# Patient Record
Sex: Female | Born: 1980 | Race: White | Hispanic: No | Marital: Single | Smoking: Former smoker
Health system: Southern US, Community
[De-identification: ages and names within clinical notes are randomized; demographics above are authoritative.]

## PROBLEM LIST (undated history)

## (undated) DIAGNOSIS — N83209 Unspecified ovarian cyst, unspecified side: Secondary | ICD-10-CM

## (undated) HISTORY — DX: Unspecified ovarian cyst, unspecified side: N83.209

## (undated) HISTORY — PX: WISDOM TOOTH EXTRACTION: SHX21

---

## 2010-10-02 ENCOUNTER — Ambulatory Visit (INDEPENDENT_AMBULATORY_CARE_PROVIDER_SITE_OTHER): Payer: 59 | Admitting: Physician Assistant

## 2010-10-02 ENCOUNTER — Encounter: Payer: Self-pay | Admitting: *Deleted

## 2010-10-02 VITALS — BP 140/79 | HR 73 | Temp 98.3°F | Resp 16 | Ht 66.0 in | Wt 180.0 lb

## 2010-10-02 DIAGNOSIS — Z01419 Encounter for gynecological examination (general) (routine) without abnormal findings: Secondary | ICD-10-CM

## 2010-10-02 DIAGNOSIS — N83209 Unspecified ovarian cyst, unspecified side: Secondary | ICD-10-CM

## 2010-10-16 ENCOUNTER — Encounter: Payer: Self-pay | Admitting: Family Medicine

## 2010-10-16 ENCOUNTER — Inpatient Hospital Stay (INDEPENDENT_AMBULATORY_CARE_PROVIDER_SITE_OTHER)
Admission: RE | Admit: 2010-10-16 | Discharge: 2010-10-16 | Disposition: A | Discharge status: Home / Self Care | Payer: 59 | Source: Ambulatory Visit | Attending: Family Medicine | Admitting: Family Medicine

## 2010-10-16 DIAGNOSIS — L03119 Cellulitis of unspecified part of limb: Secondary | ICD-10-CM

## 2010-10-18 ENCOUNTER — Ambulatory Visit
Admission: RE | Admit: 2010-10-18 | Discharge: 2010-10-18 | Disposition: A | Discharge status: Home / Self Care | Payer: 59 | Source: Ambulatory Visit | Attending: Physician Assistant | Admitting: Physician Assistant

## 2010-10-18 ENCOUNTER — Telehealth (INDEPENDENT_AMBULATORY_CARE_PROVIDER_SITE_OTHER): Payer: Self-pay | Admitting: *Deleted

## 2010-10-18 DIAGNOSIS — N83209 Unspecified ovarian cyst, unspecified side: Secondary | ICD-10-CM

## 2010-10-20 ENCOUNTER — Telehealth: Payer: Self-pay | Admitting: Emergency Medicine

## 2010-10-30 ENCOUNTER — Ambulatory Visit: Payer: 59 | Admitting: Physician Assistant

## 2010-10-30 VITALS — BP 146/90 | HR 78 | Temp 98.2°F | Resp 17 | Ht 67.0 in | Wt 178.0 lb

## 2010-10-30 DIAGNOSIS — N83299 Other ovarian cyst, unspecified side: Secondary | ICD-10-CM

## 2010-10-30 NOTE — Patient Instructions (Signed)
Ovarian Cyst   The ovaries are small organs that are on each side of the uterus. The ovaries are the organs that produce the female hormones, estrogen and progesterone. An ovarian cyst is a sac filled with fluid that can vary in its size. It is normal for a small cyst to form in women who are in the childbearing age and who have menstrual periods. This type of cyst is called a follicle cyst that becomes an ovulation cyst (corpus luteum cyst) after it produces the women’s egg. It later goes away on its own if the woman does not become pregnant. There are other kinds of ovarian cysts that may cause problems and may need to be treated. The most serious problem is a cyst with cancer. It should be noted that menopausal women who have an ovarian cyst are at a higher risk of it being a cancer cyst. They should be evaluated very quickly, thoroughly and followed closely. This is especially true in menopausal women because of the high rate of ovarian cancer in women in menopause.   CAUSES AND TYPES OF OVARIAN CYSTS:   FUNCTIONAL CYST: The follicle/corpus luteum cyst is a functional cyst that occurs every month during ovulation with the menstrual cycle. They go away with the next menstrual cycle if the woman does not get pregnant. Usually, there are no symptoms with a functional cyst.   ENDOMETRIOMA CYST: This cyst develops from the lining of the uterus tissue. This cyst gets in or on the ovary. It grows every month from the bleeding during the menstrual period. It is also called a “chocolate cyst” because it becomes filled with blood that turns brown. This cyst can cause pain in the lower abdomen during intercourse and with your menstrual period.   CYSTADENOMA CYST: This cyst develops from the cells on the outside of the ovary. They usually are not cancerous. They can get very big and cause lower abdomen pain and pain with intercourse. This type of cyst can twist on itself, cut off its blood supply and cause severe pain. It  also can easily rupture and cause a lot of pain.   DERMOID CYST: This type of cyst is sometimes found in both ovaries. They are found to have different kinds of body tissue in the cyst. The tissue includes skin, teeth, hair, and/or cartilage. They usually do not have symptoms unless they get very big. Dermoid cysts are rarely cancerous.   POLYCYSTIC OVARY: This is a rare condition with hormone problems that produces many small cysts on both ovaries. The cysts are follicle-like cysts that never produce an egg and become a corpus luteum. It can cause an increase in body weight, infertility, acne, increase in body and facial hair and lack of menstrual periods or rare menstrual periods. Many women with this problem develop type 2 diabetes. The exact cause of this problem is unknown. A polycystic ovary is rarely cancerous.   THECA LUTEIN CYST: Occurs when too much hormone (human chorionic gonadotropin) is produced and over-stimulates the ovaries to produce an egg. They are frequently seen when doctors stimulate the ovaries for invitro-fertilization (test tube babies).   LUTEOMA CYST: This cyst is seen during pregnancy. Rarely it can cause an obstruction to the birth canal during labor and delivery. They usually go away after delivery.   SYMPTOMS   Pelvic pain or pressure.   Pain during sexual intercourse.   Increasing girth (swelling) of the abdomen.   Abnormal menstrual periods.   Increasing pain with menstrual   periods.   You stop having menstrual periods and you are not pregnant.   DIAGNOSIS   The diagnosis can be made during:   Routine or annual pelvic examination (common).   Ultrasound.   X-ray of the pelvis.   CT Scan.   MRI.   Blood tests.   TREATMENT   Treatment may only be to follow the cyst monthly for 2 to 3 months with your caregiver. Many go away on their own, especially functional cysts.   May be aspirated (drained) with a long needle with ultrasound, or by laparoscopy (inserting a tube into the pelvis  through a small incision).   The whole cyst can be removed by laparoscopy.   Sometimes the cyst may need to be removed through an incision in the lower abdomen.   Hormone treatment is sometimes used to help dissolve certain cysts.   Birth control pills are sometimes used to help dissolve certain cysts.   HOME CARE INSTRUCTIONS   Follow your caregiver's advice regarding:   Medicine.   Follow up visits to evaluate and treat the cyst.   You may need to come back or make an appointment with another caregiver, to find the exact cause of your cyst, if your caregiver is not a gynecologist.   Get your yearly and recommended pelvic examinations and Pap tests.   Let your caregiver know if you have had an ovarian cyst in the past.   SEEK MEDICAL CARE IF:   Your periods are late, irregular, they stop, or are painful.   Your stomach (abdomen) or pelvic pain does not go away.   Your stomach becomes larger or swollen.   You have pressure on your bladder or trouble emptying your bladder completely.   You have painful sexual intercourse.   You have feelings of fullness, pressure, or discomfort in your stomach.   You lose weight for no apparent reason.   You feel generally ill.   You become constipated.   You lose your appetite.   You develop acne.   You have an increase in body and facial hair.   You are gaining weight, without changing your exercise and eating habits.   You think you are pregnant.   SEEK IMMEDIATE MEDICAL CARE IF:   You have increasing abdominal pain.   You feel sick to your stomach (nausea) and/or vomit.   You develop a fever that comes on suddenly.   You develop abdominal pain during a bowel movement.   Your menstrual periods become heavier than usual.   Document Released: 01/22/2005 Document Re-Released: 01/10/2009   [employer]® Patient Information ©2011 [employer].

## 2010-10-30 NOTE — Progress Notes (Signed)
Chief Complaint:  Results   Ruth Carlson is  30 y.o. G0P0000.  Patient's last menstrual period was 10/12/2010..  She presents complaining of Results Pt presents to discuss results from FU pelvic ultrasound to re-evaluate left ovarian cyst. No complaints   Past Medical History: Past Medical History  Diagnosis Date  . Ovarian cyst     Past Surgical History: Past Surgical History  Procedure Date  . Wisdom tooth extraction     Family History: Family History  Problem Relation Age of Onset  . Diabetes Father   . Diabetes Paternal Grandfather   . Diabetes Paternal Grandmother   . Heart attack Paternal Grandfather   . Hypertension Father   . Hypertension Mother   . Hypertension Maternal Grandfather   . Hypertension Maternal Grandfather   . Hypertension Paternal Grandmother   . Hypertension Paternal Grandfather     Social History: History  Substance Use Topics  . Smoking status: Former Smoker -- 1.0 packs/day for 5 years    Types: Cigarettes    Quit date: 07/02/2002  . Smokeless tobacco: Never Used  . Alcohol Use: Yes     socially    Allergies:  Allergies  Allergen Reactions  . Azithromycin Other (See Comments)    Severe stomach pain  . Ciprocin-Fluocin-Procin (Fluocinolone Acetonide) Tinitus  . Doxycycline Other (See Comments)    Stomach ache     (Not in a hospital admission)  Review of Systems - Negative except what has been reviewed in the HPI  Physical Exam   Blood pressure 146/90, pulse 78, temperature 98.2 F (36.8 C), temperature source Oral, resp. rate 17, height 5\' 7"  (1.702 m), weight 178 lb (80.74 kg), last menstrual period 10/12/2010.  General: General appearance - alert, well appearing, and in no distress and oriented to person, place, and time Mental status - alert, oriented to person, place, and time, normal mood, behavior, speech, dress, motor activity, and thought processes, affect appropriate to mood Focused Gynecological Exam:  examination not indicated   US Pelvis Complete  10/18/2010  *RADIOLOGY REPORT*  Clinical Data: Cystic pelvic mass seen on outside ultrasound.  TRANSABDOMINAL AND TRANSVAGINAL ULTRASOUND OF PELVIS  Technique:  Both transabdominal and transvaginal ultrasound examinations of the pelvis were performed.  Transabdominal technique was performed for global imaging of the pelvis including uterus, ovaries, adnexal regions, and pelvic cul-de-sac.  It was necessary to proceed with endovaginal exam following the transabdominal exam to visualize the endometrium, ovaries, and cystic left adnexal lesion in greater detail.  Comparison:  None.  Findings: Uterus:  7.5 x 3.3 x 4.4 cm.  No fibroids or other uterine masses identified.  Endometrium: Double layer thickness measures 7 mm transvaginally. No focal lesion identified.  Right ovary: 2.4 x 2.0 x 2.2 cm. 1.3 cm corpus luteum cyst noted. Otherwise normal appearance.  Left ovary: 5.2 x 2.9 x 3.9 cm.  A complex cystic lesion containing uniform low level internal echoes is seen which measures 3.7 x 2.4 x 3.5 cm. A hyperechoic nodule is also seen within this lesion which shows some acoustic shadowing, and this may represent a focus of fat or possibly calcification. No internal blood flow is seen on color Doppler ultrasound.  Differential diagnosis includes ovarian dermoid and endometrioma, with other cystic ovarian neoplasm considered less likely.  Other Findings:  Trace free fluid seen in pelvic cul-de-sac.  IMPRESSION:  1.  3.7 cm complex cystic lesion involving the left ovary, with a hyperechoic nodule which may represent internal fat or calcification. Main differential  diagnosis includes ovarian dermoid and endometrioma, with other cystic ovarian neoplasm considered less likely. Consider surgical evaluation versus pelvic MRI without and with contrast for further characterization.  2.  Normal uterus and right ovary.  Original Report Authenticated By: Danae Orleans, M.D.      Assessment: Left Ovarian complex cyst  Plan: Reviewed the results of the [place] at length. Recommend surgery for removal. Discussed with patient cyst likely to increase in size over time which increases the risk of ovarian torsion. 1% risk of malignancy with dermoid. Pt verbalizes understanding and declines surgery at this time. Will re-evaluate in 6 months via pelvic US. S/s torsion reviewed.   Ruth Carlson E. 10/30/2010,3:37 PM

## 2010-10-31 NOTE — Progress Notes (Signed)
Chief Complaint:  No chief complaint on file.   Ruth Carlson is  30 y.o. G0P0000.  Patient's last menstrual period was 09/20/2010..    She presents annual exam and follow-up on previous dx of ovarian cyst . Recently moved to area for job. Reports being dx with left ovarian cyst ~ 6 months ago, was unable to FU in home state for [place] as planned due to recent move. States ~ ever 3-4 months she has pain on left side near the time of her period that does not require medications for relief. Denies sexual activity. Declines hormone BC.   Obstetrical/Gynecological History: Pertinent Gynecological History: Menses: flow is moderate, regular every month without intermenstrual spotting and with minimal cramping Bleeding: none Contraception: abstinence DES exposure: denies Blood transfusions: none Sexually transmitted diseases: no past history Previous GYN Procedures: none  Last mammogram: N/A due to age and fam hx Date: n/a Last pap: normal Date: ~ 6 months ago   Past Medical History: Past Medical History  Diagnosis Date  . Ovarian cyst     Past Surgical History: Past Surgical History  Procedure Date  . Wisdom tooth extraction     Family History: Family History  Problem Relation Age of Onset  . Diabetes Father   . Diabetes Paternal Grandfather   . Diabetes Paternal Grandmother   . Heart attack Paternal Grandfather   . Hypertension Father   . Hypertension Mother   . Hypertension Maternal Grandfather   . Hypertension Maternal Grandfather   . Hypertension Paternal Grandmother   . Hypertension Paternal Grandfather     Social History: History  Substance Use Topics  . Smoking status: Former Smoker -- 1.0 packs/day for 5 years    Types: Cigarettes    Quit date: 07/02/2002  . Smokeless tobacco: Never Used  . Alcohol Use: Yes     socially    Allergies:  Allergies  Allergen Reactions  . Azithromycin Other (See Comments)    Severe stomach pain  . Ciprocin-Fluocin-Procin  (Fluocinolone Acetonide) Tinitus  . Doxycycline Other (See Comments)    Stomach ache     Review of Systems - Negative except what has been reviewed in the HPI  Physical Exam   Blood pressure 140/79, pulse 73, temperature 98.3 F (36.8 C), temperature source Oral, resp. rate 16, height 5\' 6"  (1.676 m), weight 180 lb (81.647 kg), last menstrual period 09/20/2010.  General: General appearance - alert, well appearing, and in no distress, oriented to person, place, and time and normal appearing weight Mental status - alert, oriented to person, place, and time, normal mood, behavior, speech, dress, motor activity, and thought processes, affect appropriate to mood Neck - supple, no significant adenopathy Chest - clear to auscultation, no wheezes, rales or rhonchi, symmetric air entry Heart - normal rate, regular rhythm, normal S1, S2, no murmurs, rubs, clicks or gallops Abdomen - soft, nontender, nondistended, no masses or organomegaly Pelvic - normal external genitalia, vulva, vagina, cervix, uterus and adnexa Neurological - alert, oriented, normal speech, no focal findings or movement disorder noted, screening mental status exam normal, cranial nerves II through XII intact Musculoskeletal - no joint tenderness, deformity or swelling Extremities - peripheral pulses normal, no pedal edema, no clubbing or cyanosis   Labs: No results found for this or any previous visit (from the past 24 hour(s)). Imaging Studies:  Pelvic US order to re-evaluate left ovarian cyst   Assessment: 30 yo Well Woman  Hx of left ovarian cyst, mostly asymtomatic  Plan: ROI for previous  US and pap FU in 2-3 weeks to reviewed [place] report  Hadasa Gasner E.

## 2011-01-08 NOTE — Telephone Encounter (Signed)
  Phone Note Call from Patient   Caller: Patient Summary of Call: pt called and c/o GI upset from doxycycline. wants to know if ABT can be changed. pt # [phone] Initial call taken by: Clemens Catholic LPN,  October 20, 2010 11:27 AM  Follow-up for Phone Call        Change to Bactrim DS two times a day     Appended Document:  10/20/10-12:58pm- advised pt of med change and called into [facility] on [address]/Christy Locklear,LPN

## 2011-01-08 NOTE — Progress Notes (Signed)
Summary: SKIN INFECTION...WSE(PROCEDURE)   Vital Signs:  Patient Profile:   30 Years Old Female CC:      SKIN INFECTION Height:     66 inches Weight:      181 pounds O2 Sat:      100 % O2 treatment:    Room Air Temp:     99.0 degrees F oral Pulse rate:   67 / minute Resp:     16 per minute BP sitting:   144 / 84  (left arm) Cuff size:   regular  Vitals Entered By: Linton Flemings RN (October 16, 2010 6:21 PM)                  Updated Prior Medication List: No Medications History of Present Illness Chief Complaint: SKIN INFECTION History of Present Illness:  Subjective:  Patient  states that she developed some chafing on her inner thighs about a week ago while at the beach.  Over the past several days she has developed increased soreness at the site, and has had irritation from applying adhesive bandages.  She has a history of MRSA skin infection  REVIEW OF SYSTEMS Constitutional Symptoms      Denies fever, chills, night sweats, weight loss, weight gain, and fatigue.  Eyes       Denies change in vision, eye pain, eye discharge, glasses, contact lenses, and eye surgery. Ear/Nose/Throat/Mouth       Denies hearing loss/aids, change in hearing, ear pain, ear discharge, dizziness, frequent runny nose, frequent nose bleeds, sinus problems, sore throat, hoarseness, and tooth pain or bleeding.  Respiratory       Denies dry cough, productive cough, wheezing, shortness of breath, asthma, bronchitis, and emphysema/COPD.  Cardiovascular       Denies murmurs, chest pain, and tires easily with exhertion.    Gastrointestinal       Denies stomach pain, nausea/vomiting, diarrhea, constipation, blood in bowel movements, and indigestion. Genitourniary       Denies painful urination, kidney stones, and loss of urinary control. Neurological       Denies paralysis, seizures, and fainting/blackouts. Musculoskeletal       Complains of redness and swelling.      Denies muscle pain, joint  pain, joint stiffness, decreased range of motion, muscle weakness, and gout.  Skin       Denies bruising, unusual mles/lumps or sores, and hair/skin or nail changes.  Psych       Denies mood changes, temper/anger issues, anxiety/stress, speech problems, depression, and sleep problems. Other Comments: NOTED X 1 WEEK AGO   Past History:  Past Medical History: MRSA  Past Surgical History: STAPH INFECTION- CUT/PACK  Family History: Reviewed history and no changes required. HTN DMII  Social History: Reviewed history and no changes required. SMOKE-NO ALCOHOL-YES REC. DRUGS-NO   Objective:  Appearance:  Patient appears healthy, stated age, and in no acute distress  Skin:  On the left upper inner thigh is an 8mm dia area of induration/tenderness on a 1.5cm dia base of erythema.  Area is not fluctuant.  There is also a rectangular area of erythema at site of bandage adhesive. Assessment New Problems: CELLULITIS, LEG, LEFT (ICD-682.6)  MILD CELLULITIS AND ADVERSE REACTION TO BANDAGE ADHESIVE  Plan New Medications/Changes: DOXYCYCLINE HYCLATE 100 MG CAPS (DOXYCYCLINE HYCLATE) One by mouth two times a day  #20 x 0, 10/16/2010, Donna Christen MD  New Orders: New Patient Level III [phone] Services provided After hours-Weekends-Holidays [99051] Duoderm Dressing [A6250] Planning Comments:  Applied Mepelex Border dressing; advised to leave in place 3 to 4 days, then apply new bandage (given one to take home).  Begin doxycycline. Return if develops increasing pain/swelling (may need I and D)   The patient and/or caregiver has been counseled thoroughly with regard to medications prescribed including dosage, schedule, interactions, rationale for use, and possible side effects and they verbalize understanding.  Diagnoses and expected course of recovery discussed and will return if not improved as expected or if the condition worsens. Patient and/or caregiver verbalized understanding.    Prescriptions: DOXYCYCLINE HYCLATE 100 MG CAPS (DOXYCYCLINE HYCLATE) One by mouth two times a day  #20 x 0   Entered and Authorized by:   Donna Christen MD   Signed by:   Donna Christen MD on 10/16/2010   Method used:   Print then Give to Patient   RxID:   [id number]   Orders Added: 1)  New Patient Level III [13086] 2)  Services provided After hours-Weekends-Holidays [99051] 3)  Duoderm Dressing [A6250]

## 2011-01-08 NOTE — Telephone Encounter (Signed)
  Phone Note Outgoing Call   Call placed by: Clemens Catholic LPN,  October 18, 2010 2:44 PM Summary of Call: call back: pt states that her skin infection is about the same. advised her to complete all ABT if fails to improve or worsens to f/u here or with her PCP. pt agrees. Initial call taken by: Clemens Catholic LPN,  October 18, 2010 2:45 PM

## 2012-04-25 IMAGING — US US PELVIS COMPLETE
1 series · 13 of 25 positions shown · non-contrast
Comparison: None.

CLINICAL DATA: Cystic pelvic mass seen on outside ultrasound.

TRANSABDOMINAL AND TRANSVAGINAL ULTRASOUND OF PELVIS
TECHNIQUE: Both transabdominal and transvaginal ultrasound
examinations of the pelvis were performed.  Transabdominal
technique was performed for global imaging of the pelvis including
uterus, ovaries, adnexal regions, and pelvic cul-de-sac.
It was necessary to proceed with endovaginal exam following the
transabdominal exam to visualize the endometrium, ovaries, and
cystic left adnexal lesion in greater detail.

[Series 1: us pelvis complete · 0.25mm/px · 56 acquisitions, 13 frames shown]
[im 1/56]
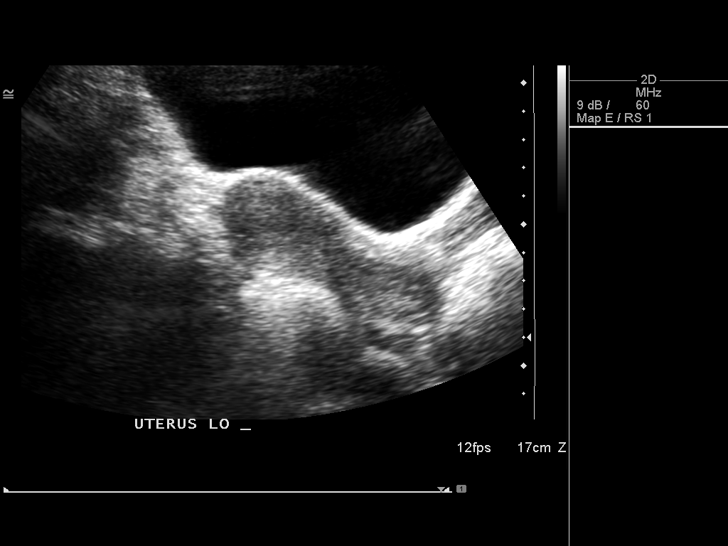
[im 5/56]
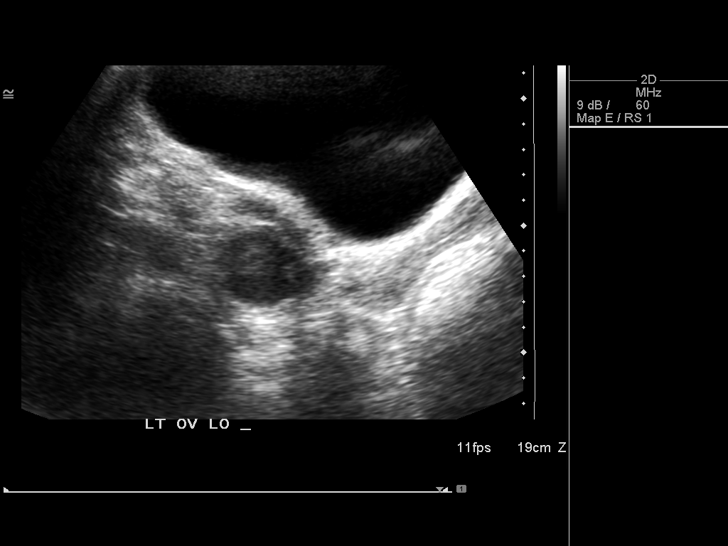
[im 10/56]
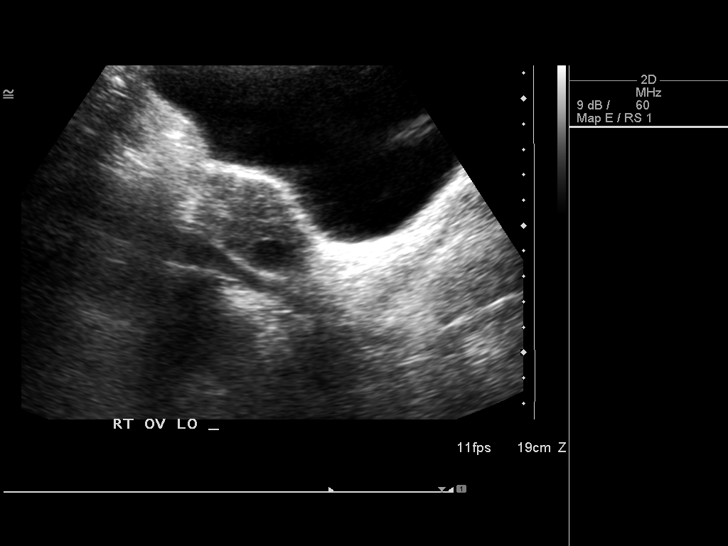
[im 14/56]
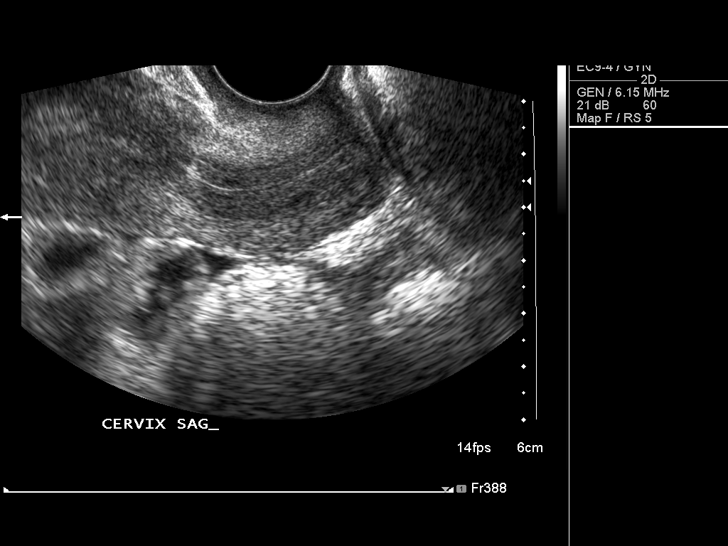
[im 19/56]
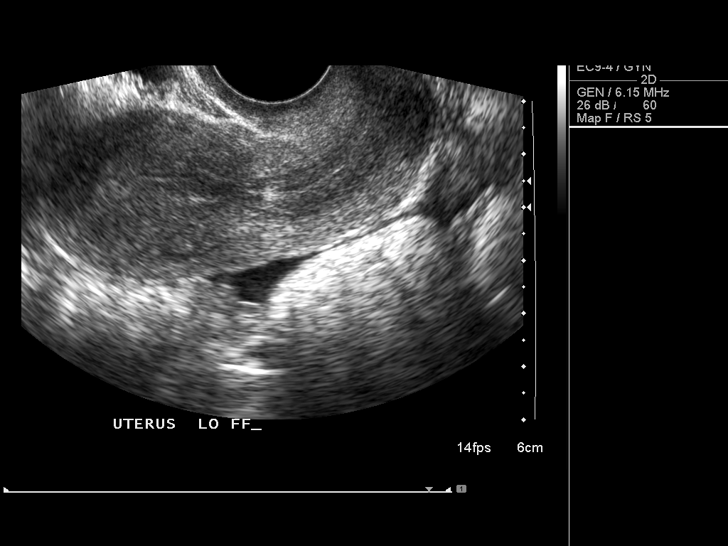
[im 23/56]
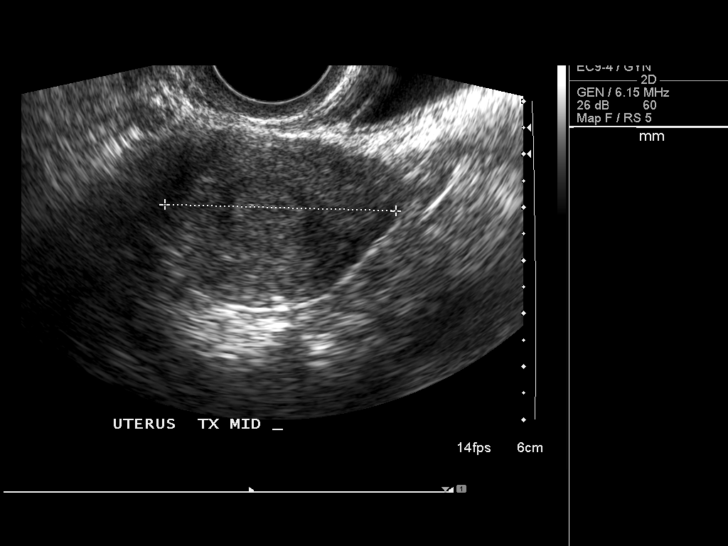
[im 28/56]
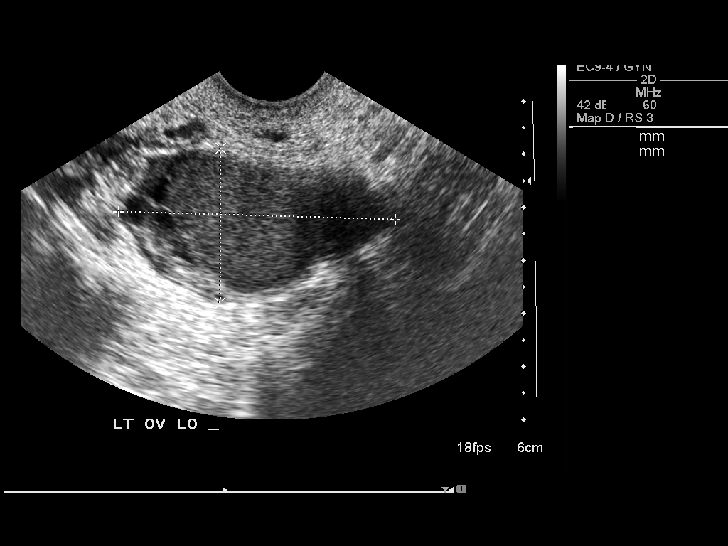
[im 33/56]
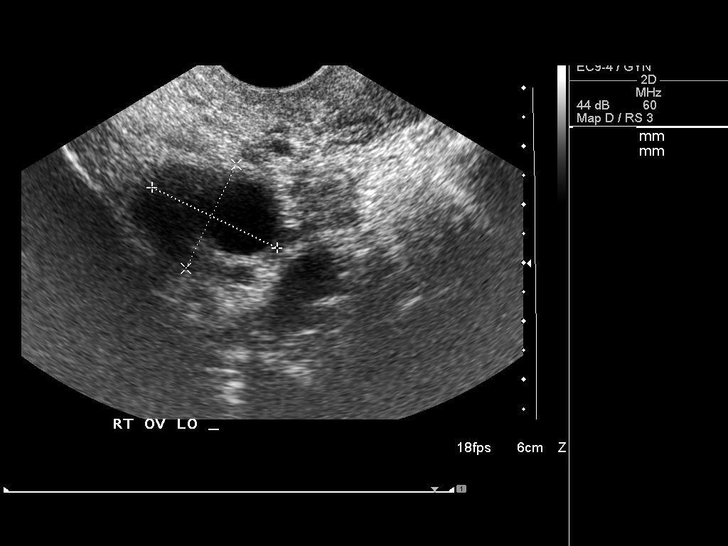
[im 37/56]
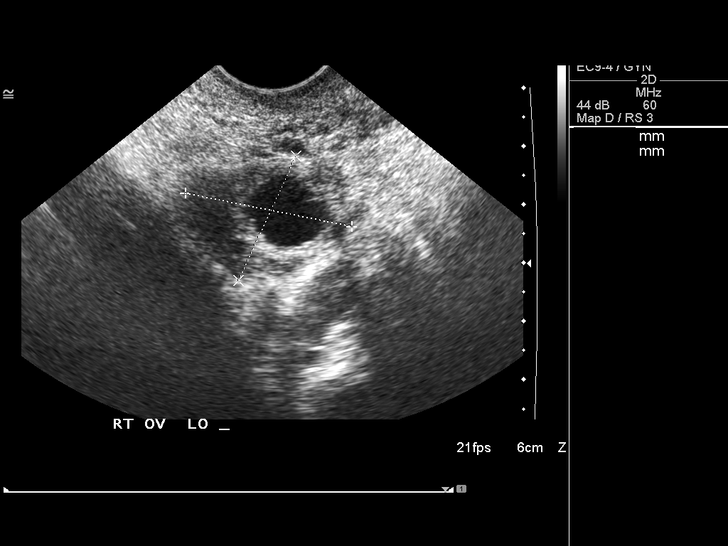
[im 42/56]
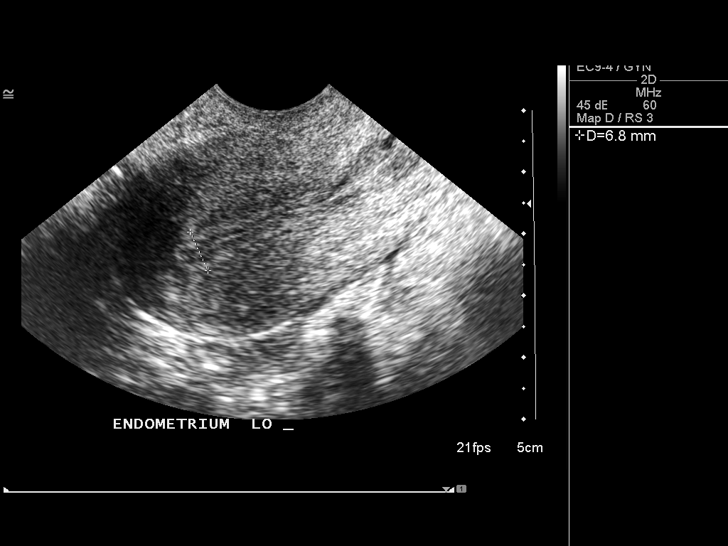
[im 46/56]
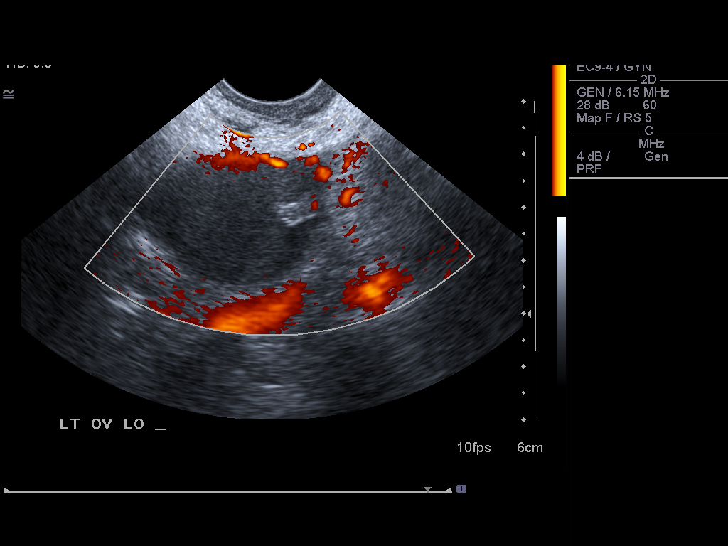
[im 51/56]
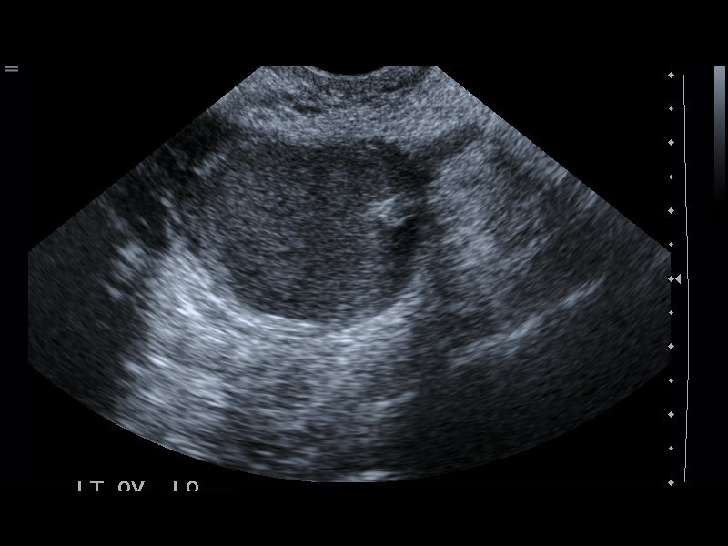
[im 56/56]
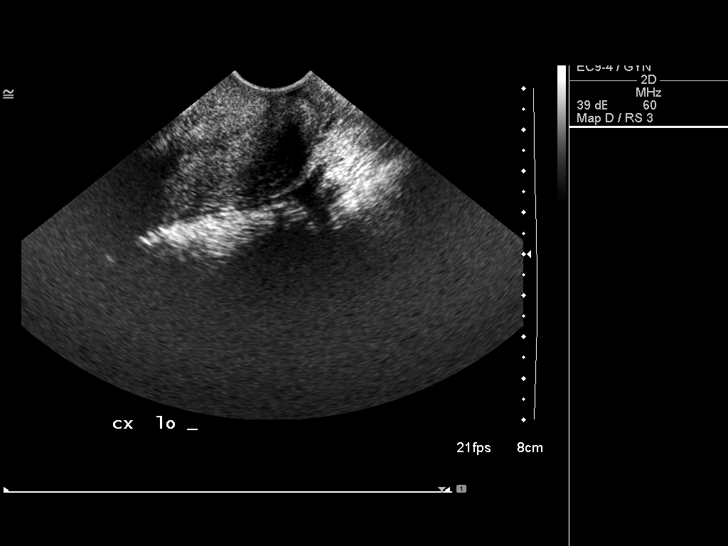

[13 of 25 positions shown; findings below may reference images not displayed]

FINDINGS: Uterus:  7.5 x 3.3 x 4.4 cm.  No fibroids or other uterine masses
identified.

Endometrium: Double layer thickness measures 7 mm transvaginally.
No focal lesion identified.

Right ovary: 2.4 x 2.0 x 2.2 cm. 1.3 cm corpus luteum cyst noted.
Otherwise normal appearance.

Left ovary: 5.2 x 2.9 x 3.9 cm.  A complex cystic lesion containing
uniform low level internal echoes is seen which measures 3.7 x
x 3.5 cm. A hyperechoic nodule is also seen within this lesion
which shows some acoustic shadowing, and this may represent a focus
of fat or possibly calcification. No internal blood flow is seen on
color Doppler ultrasound.  Differential diagnosis includes ovarian
dermoid and endometrioma, with other cystic ovarian neoplasm
considered less likely.

Other Findings:  Trace free fluid seen in pelvic cul-de-sac.
IMPRESSION: 1.  3.7 cm complex cystic lesion involving the left ovary, with a
hyperechoic nodule which may represent internal fat or
calcification. Main differential diagnosis includes ovarian dermoid
and endometrioma, with other cystic ovarian neoplasm considered
less likely. Consider surgical evaluation versus pelvic MRI without
and with contrast for further characterization.

2.  Normal uterus and right ovary.

## 2012-06-06 ENCOUNTER — Encounter: Payer: Self-pay | Admitting: Emergency Medicine

## 2012-06-06 ENCOUNTER — Emergency Department (INDEPENDENT_AMBULATORY_CARE_PROVIDER_SITE_OTHER)
Admission: EM | Admit: 2012-06-06 | Discharge: 2012-06-06 | Disposition: A | Discharge status: Home / Self Care | Payer: 59 | Source: Home / Self Care | Attending: Family Medicine | Admitting: Family Medicine

## 2012-06-06 DIAGNOSIS — J069 Acute upper respiratory infection, unspecified: Secondary | ICD-10-CM

## 2012-06-06 DIAGNOSIS — J029 Acute pharyngitis, unspecified: Secondary | ICD-10-CM

## 2012-06-06 MED ORDER — AMOXICILLIN 875 MG PO TABS: 875.0000 mg | ORAL_TABLET | Freq: Two times a day (BID) | ORAL | Status: DC

## 2012-06-06 MED ORDER — BENZONATATE 200 MG PO CAPS: 200.0000 mg | ORAL_CAPSULE | Freq: Every day | ORAL | Status: DC

## 2012-06-06 NOTE — ED Notes (Signed)
Sore throat, dry cough, congestion, headache x 1 week

## 2012-06-06 NOTE — ED Provider Notes (Signed)
History     CSN: [id number]  Arrival date & time 06/06/12  4098   First MD Initiated Contact with Patient 06/06/12 [phone]      Chief Complaint  Patient presents with  . Sore Throat       HPI Comments: Patient developed sinus congestion and sneezing about one week ago, and wondered if her allergies were flaring (although she has not had problems with seasonal rhinitis since childhood).  She then developed a mild cough, and two days ago she began developing a sore throat.  She has felt fatigued, and has had a headache.  No fevers, chills, and sweats.  Her cough is non-productive and worse at night.  The history is provided by the patient.    Past Medical History  Diagnosis Date  . Ovarian cyst     Past Surgical History  Procedure Laterality Date  . Wisdom tooth extraction      Family History  Problem Relation Age of Onset  . Diabetes Father   . Hypertension Father   . Diabetes Paternal Grandfather   . Heart attack Paternal Grandfather   . Hypertension Paternal Grandfather   . Diabetes Paternal Grandmother   . Hypertension Paternal Grandmother   . Hypertension Mother   . Hypertension Maternal Grandfather     History  Substance Use Topics  . Smoking status: Former Smoker -- 1.00 packs/day for 5 years    Types: Cigarettes    Quit date: 07/02/2002  . Smokeless tobacco: Never Used  . Alcohol Use: Yes     Comment: socially    OB History   Grav Para Term Preterm Abortions TAB SAB Ect Mult Living   0 0 0 0 0 0 0 0 0 0       Review of Systems + sore throat + cough No pleuritic pain No wheezing + nasal congestion + post-nasal drainage No sinus pain/pressure No itchy/red eyes No earache No hemoptysis No SOB No fever/chills No nausea No vomiting No abdominal pain No diarrhea No urinary symptoms No skin rashes + fatigue + myalgias + headache Used OTC meds without relief   Allergies  Azithromycin; Ciprocin-fluocin-procin; and Doxycycline  Home  Medications   Current Outpatient Rx  Name  Route  Sig  Dispense  Refill  . amoxicillin (AMOXIL) 875 MG tablet   Oral   Take 1 tablet (875 mg total) by mouth 2 (two) times daily. (Rx void after 06/14/12)   20 tablet   0   . b complex vitamins tablet   Oral   Take 1 tablet by mouth daily.           . benzonatate (TESSALON) 200 MG capsule   Oral   Take 1 capsule (200 mg total) by mouth at bedtime.   12 capsule   0   . cetirizine (ZYRTEC) 10 MG tablet   Oral   Take 10 mg by mouth as needed.             BP 129/85  Pulse 81  Temp(Src) 98.8 F (37.1 C) (Oral)  Resp 14  Ht 5' 6.5" (1.689 m)  Wt 173 lb (78.472 kg)  BMI 27.51 kg/m2  SpO2 98%  Physical Exam Nursing notes and Vital Signs reviewed. Appearance:  Patient appears healthy, stated age, and in no acute distress Eyes:  Pupils are equal, round, and reactive to light and accomodation.  Extraocular movement is intact.  Conjunctivae are not inflamed  Ears:  Canals normal.  Tympanic membranes normal.  Nose:  Mildly congested turbinates.  No sinus tenderness.   Pharynx:   Minimally erythematous Neck:  Supple.   Tender shotty posterior nodes are palpated bilaterally  Lungs:  Clear to auscultation.  Breath sounds are equal.  Heart:  Regular rate and rhythm without murmurs, rubs, or gallops.  Abdomen:  Nontender without masses or hepatosplenomegaly.  Bowel sounds are present.  No CVA or flank tenderness.  Extremities:  No edema.  No calf tenderness Skin:  No rash present.   ED Course  Procedures  none  Labs Reviewed  STREP A DNA PROBE pending  POCT RAPID STREP A (OFFICE) negative      1. Acute pharyngitis   2. Acute upper respiratory infections of unspecified site; suspect viral URI       MDM  There is no evidence of bacterial infection today.   Throat culture pending Prescription written for Benzonatate [facility]) to take at bedtime for night-time cough.  Take Mucinex D (guaifenesin with decongestant)  twice daily for congestion.  Increase fluid intake, rest. May use Afrin nasal spray (or generic oxymetazoline) twice daily for about 5 days.  Also recommend using saline nasal spray several times daily and saline nasal irrigation (AYR is a common brand) Stop all antihistamines for now, and other non-prescription cough/cold preparations. May take Ibuprofen 200mg , 4 tabs every 8 hours with food for sore throat, etc. Begin Amoxicillin if not improving about one week or if persistent fever develops (Given a prescription to hold, with an expiration date)  Follow-up with family doctor if not improving about10 days.          Lattie Haw, MD 06/06/12 [phone]

## 2012-06-07 ENCOUNTER — Telehealth: Payer: Self-pay | Admitting: *Deleted

## 2012-06-07 LAB — STREP A DNA PROBE: GASP: NEGATIVE

## 2012-06-16 ENCOUNTER — Encounter: Payer: Self-pay | Admitting: *Deleted

## 2012-06-16 ENCOUNTER — Emergency Department (INDEPENDENT_AMBULATORY_CARE_PROVIDER_SITE_OTHER)
Admission: EM | Admit: 2012-06-16 | Discharge: 2012-06-16 | Disposition: A | Discharge status: Home / Self Care | Payer: 59 | Source: Home / Self Care | Attending: Family Medicine | Admitting: Family Medicine

## 2012-06-16 DIAGNOSIS — R300 Dysuria: Secondary | ICD-10-CM

## 2012-06-16 DIAGNOSIS — N3000 Acute cystitis without hematuria: Secondary | ICD-10-CM

## 2012-06-16 LAB — POCT URINALYSIS DIP (MANUAL ENTRY)
Glucose, UA: NEGATIVE
Ketones, POC UA: NEGATIVE
Spec Grav, UA: 1.02 (ref 1.005–1.03)
Urobilinogen, UA: 0.2 (ref 0–1)

## 2012-06-16 MED ORDER — PHENAZOPYRIDINE HCL 200 MG PO TABS: 200.0000 mg | ORAL_TABLET | Freq: Three times a day (TID) | ORAL | Status: AC

## 2012-06-16 MED ORDER — NITROFURANTOIN MONOHYD MACRO 100 MG PO CAPS
100.0000 mg | ORAL_CAPSULE | Freq: Two times a day (BID) | ORAL | Status: AC
Start: 2012-06-16 — End: ?

## 2012-06-16 NOTE — ED Provider Notes (Signed)
History     CSN: [id number]  Arrival date & time 06/16/12  1444   First MD Initiated Contact with Patient 06/16/12 1539      Chief Complaint  Patient presents with  . Urinary Tract Infection       HPI Comments: Patient complains of dysuria and frequency for 3 days.  She developed some hematuria today.  No abdominal or pelvic pain.  Patient's last menstrual period was 06/05/2012.     Patient is a 32 y.o. female presenting with dysuria. The history is provided by the patient.  Dysuria  This is a new problem. Episode onset: 3 days ago. The problem occurs every urination. The problem has been gradually worsening. The quality of the pain is described as burning. The pain is mild. There has been no fever. Associated symptoms include frequency, hematuria, hesitancy and urgency. Pertinent negatives include no chills, no sweats, no nausea, no vomiting, no discharge and no flank pain. She has tried nothing for the symptoms.    Past Medical History  Diagnosis Date  . Ovarian cyst     Past Surgical History  Procedure Laterality Date  . Wisdom tooth extraction      Family History  Problem Relation Age of Onset  . Diabetes Father   . Hypertension Father   . Diabetes Paternal Grandfather   . Heart attack Paternal Grandfather   . Hypertension Paternal Grandfather   . Diabetes Paternal Grandmother   . Hypertension Paternal Grandmother   . Hypertension Mother   . Hypertension Maternal Grandfather     History  Substance Use Topics  . Smoking status: Former Smoker -- 1.00 packs/day for 5 years    Types: Cigarettes    Quit date: 07/02/2002  . Smokeless tobacco: Never Used  . Alcohol Use: Yes     Comment: socially    OB History   Grav Para Term Preterm Abortions TAB SAB Ect Mult Living   0 0 0 0 0 0 0 0 0 0       Review of Systems  Constitutional: Negative for chills.  Gastrointestinal: Negative for nausea and vomiting.  Genitourinary: Positive for dysuria, hesitancy, urgency,  frequency and hematuria. Negative for flank pain.  All other systems reviewed and are negative.    Allergies  Azithromycin; Ciprocin-fluocin-procin; and Doxycycline  Home Medications   Current Outpatient Rx  Name  Route  Sig  Dispense  Refill  . b complex vitamins tablet   Oral   Take 1 tablet by mouth daily.           . cetirizine (ZYRTEC) 10 MG tablet   Oral   Take 10 mg by mouth as needed.           . nitrofurantoin, macrocrystal-monohydrate, (MACROBID) 100 MG capsule   Oral   Take 1 capsule (100 mg total) by mouth 2 (two) times daily.   14 capsule   0   . phenazopyridine (PYRIDIUM) 200 MG tablet   Oral   Take 1 tablet (200 mg total) by mouth 3 (three) times daily. Take with food.   6 tablet   0     BP 124/84  Pulse 74  Temp(Src) 99 F (37.2 C) (Oral)  Resp 14  Ht 5' 6.5" (1.689 m)  Wt 175 lb (79.379 kg)  BMI 27.83 kg/m2  SpO2 99%  LMP 06/05/2012  Physical Exam Nursing notes and Vital Signs reviewed. Appearance:  Patient appears healthy, stated age, and in no acute distress Eyes:  Pupils are equal,  round, and reactive to light and accomodation.  Extraocular movement is intact.  Conjunctivae are not inflamed  Pharynx:  Normal Neck:  Supple.   No adenopathy Lungs:  Clear to auscultation.  Breath sounds are equal.  Heart:  Regular rate and rhythm without murmurs, rubs, or gallops.  Abdomen:  Nontender without masses or hepatosplenomegaly.  Bowel sounds are present.  No CVA or flank tenderness.  Extremities:  No edema.  No calf tenderness Skin:  No rash present.   ED Course  Procedures  none  Labs Reviewed  URINE CULTURE pending  POCT URINALYSIS DIP (MANUAL ENTRY)      1. Dysuria   2. Acute cystitis       MDM  Urine culture pending.  Begin Macrobid and Pyridium.  Increase fluid intake. Followup with Family Doctor if not improved in one week.         Lattie Haw, MD 06/19/12 [phone]

## 2012-06-16 NOTE — ED Notes (Signed)
Ruth Carlson c/o dysuria, polyuria x 3 days. Today she developed hematuria.

## 2012-06-19 ENCOUNTER — Telehealth: Payer: Self-pay | Admitting: *Deleted

## 2012-06-19 LAB — URINE CULTURE: Colony Count: >=100000

## 2012-06-23 ENCOUNTER — Telehealth: Payer: Self-pay | Admitting: Emergency Medicine

## 2013-03-10 ENCOUNTER — Encounter: Payer: Self-pay | Admitting: *Deleted

## 2013-03-11 ENCOUNTER — Ambulatory Visit (INDEPENDENT_AMBULATORY_CARE_PROVIDER_SITE_OTHER): Payer: 59 | Admitting: *Deleted

## 2013-03-11 DIAGNOSIS — I781 Nevus, non-neoplastic: Secondary | ICD-10-CM

## 2013-03-11 NOTE — Progress Notes (Signed)
X=.3% Sotradecol administered with a 27g butterfly.  Patient received a total of 5cc.  Treated all areas of concern. Easy access. Tol well. Anticipate good results. Follow prn. (Zapped a few tiny vessels to see if that would work-[place])  Photos: yes  Compression stockings applied: yes (med)
